# Patient Record
Sex: Male | Born: 1960 | Race: Black or African American | Hispanic: No | Marital: Married | Smoking: Never smoker
Health system: Southern US, Community
[De-identification: ages and names within clinical notes are randomized; demographics above are authoritative.]

## PROBLEM LIST (undated history)

## (undated) DIAGNOSIS — M1990 Unspecified osteoarthritis, unspecified site: Secondary | ICD-10-CM

## (undated) HISTORY — PX: MENISCUS REPAIR: SHX5179

---

## 2015-02-20 ENCOUNTER — Encounter (HOSPITAL_BASED_OUTPATIENT_CLINIC_OR_DEPARTMENT_OTHER): Payer: Self-pay

## 2015-02-20 ENCOUNTER — Emergency Department (HOSPITAL_BASED_OUTPATIENT_CLINIC_OR_DEPARTMENT_OTHER)
Admission: EM | Admit: 2015-02-20 | Discharge: 2015-02-20 | Disposition: A | Discharge status: Home / Self Care | Payer: Worker's Compensation | Attending: Emergency Medicine | Admitting: Emergency Medicine

## 2015-02-20 ENCOUNTER — Emergency Department (HOSPITAL_BASED_OUTPATIENT_CLINIC_OR_DEPARTMENT_OTHER): Payer: Worker's Compensation

## 2015-02-20 DIAGNOSIS — W1839XA Other fall on same level, initial encounter: Secondary | ICD-10-CM | POA: Insufficient documentation

## 2015-02-20 DIAGNOSIS — Y9389 Activity, other specified: Secondary | ICD-10-CM | POA: Insufficient documentation

## 2015-02-20 DIAGNOSIS — Y990 Civilian activity done for income or pay: Secondary | ICD-10-CM | POA: Insufficient documentation

## 2015-02-20 DIAGNOSIS — Z791 Long term (current) use of non-steroidal anti-inflammatories (NSAID): Secondary | ICD-10-CM | POA: Insufficient documentation

## 2015-02-20 DIAGNOSIS — S4991XA Unspecified injury of right shoulder and upper arm, initial encounter: Secondary | ICD-10-CM | POA: Diagnosis present

## 2015-02-20 DIAGNOSIS — M1990 Unspecified osteoarthritis, unspecified site: Secondary | ICD-10-CM | POA: Insufficient documentation

## 2015-02-20 DIAGNOSIS — Y9289 Other specified places as the place of occurrence of the external cause: Secondary | ICD-10-CM | POA: Diagnosis not present

## 2015-02-20 DIAGNOSIS — M79601 Pain in right arm: Secondary | ICD-10-CM

## 2015-02-20 HISTORY — DX: Unspecified osteoarthritis, unspecified site: M19.90

## 2015-02-20 MED ORDER — NAPROXEN 500 MG PO TABS: 500.0000 mg | ORAL_TABLET | Freq: Two times a day (BID) | ORAL | Status: DC

## 2015-02-20 NOTE — ED Notes (Signed)
Fell at work, mat slipped out from under him, fell on used RUE to prevent fall.  Had no c/o dizziness, SOB. Continued to work, next evening Pain in RUE increased , he was unable to lift Right Arm.  Pain is below the shoulder.

## 2015-02-20 NOTE — ED Notes (Signed)
Patient transported to x-ray. ?

## 2015-02-20 NOTE — ED Notes (Signed)
Pt ambulated to room, describes no pain at rest, however can not lift RUE without pain. Pain is an 8 with movement.  He had no episode of dizziness, SOB.  RUE is warm, + strong radial pulse, no c/o numbness, tingling.

## 2015-02-20 NOTE — ED Provider Notes (Signed)
CSN: [id number]     Arrival date & time 02/20/15  4098 History  This chart was scribed for Michael Octave, MD by Leone Payor, ED Scribe. This patient was seen in room MH05/MH05 and the patient's care was started 7:58 AM.     Chief Complaint  Patient presents with  . Arm Injury    R UE  r/t  fall at work   The history is provided by the patient. No language interpreter was used.     HPI Comments: Michael Clements is a 54 y.o. male who presents to the Emergency Department complaining of gradual onset, gradually worsened, constant RUE pain that began after a fall 2 days ago. Patient states a mat slipped out from underneath him causing him to fall on an outstretched right hand. He denies head injury or LOC. He reports having minimal pain at the time of the fall but states it worsened over the following day. He has applied icy-hot ointment without significant relief. He states the pain is aggravated by lifting or sleeping on the affected arm. He denies weakness, numbness, paresthesias, any other pain or injuries.   Past Medical History  Diagnosis Date  . Arthritis    No past surgical history on file. No family history on file. History  Substance Use Topics  . Smoking status: Not on file  . Smokeless tobacco: Not on file  . Alcohol Use: Not on file    Review of Systems  A complete 10 system review of systems was obtained and all systems are negative except as noted in the HPI and PMH.    Allergies  Review of patient's allergies indicates no known allergies.  Home Medications   Prior to Admission medications   Medication Sig Start Date End Date Taking? Authorizing Provider  naproxen (NAPROSYN) 500 MG tablet Take 1 tablet (500 mg total) by mouth 2 (two) times daily. 02/20/15   Michael Octave, MD  naproxen sodium (ANAPROX) 220 MG tablet Take 220 mg by mouth 2 (two) times daily with a meal.   Yes Historical Provider, MD   BP 130/92 mmHg  Pulse 64  Temp(Src) 97.8 F (36.6 C) (Oral)   Resp 16  Ht  (1.702 m)  Wt 204 lb (92.534 kg)  BMI 31.94 kg/m2  SpO2 100% Physical Exam  Constitutional: He is oriented to person, place, and time. He appears well-developed and well-nourished. No distress.  HENT:  Head: Normocephalic and atraumatic.  Mouth/Throat: Oropharynx is clear and moist. No oropharyngeal exudate.  Eyes: Conjunctivae and EOM are normal. Pupils are equal, round, and reactive to light.  Neck: Normal range of motion. Neck supple.  No meningismus.  Cardiovascular: Normal rate, regular rhythm, normal heart sounds and intact distal pulses.   No murmur heard. Pulmonary/Chest: Effort normal and breath sounds normal. No respiratory distress.  Abdominal: Soft. There is no tenderness. There is no rebound and no guarding.  Musculoskeletal: Normal range of motion. He exhibits tenderness. He exhibits no edema.  RUE: Pain to deltoid with abduction. No deformity, no tenderness to shoulder joint. Full ROM of elbow and wrist. Intact radial pulse.  No pain to R wrist. No snuffbox pain.  Neurological: He is alert and oriented to person, place, and time. No cranial nerve deficit. He exhibits normal muscle tone. Coordination normal.  No ataxia on finger to nose bilaterally. No pronator drift. 5/5 strength throughout. CN 2-12 intact. Negative Romberg. Equal grip strength. Sensation intact. Gait is normal.   Skin: Skin is warm.  Psychiatric:  He has a normal mood and affect. His behavior is normal.  Nursing note and vitals reviewed.   ED Course  Procedures (including critical care time)  DIAGNOSTIC STUDIES: Oxygen Saturation is 100% on RA, normal by my interpretation.    COORDINATION OF CARE: 8:04 AM Will order XRAY of right arm. Discussed treatment plan with pt at bedside and pt agreed to plan.   Labs Review Labs Reviewed - No data to display  Imaging Review Dg Shoulder Right  02/20/2015   CLINICAL DATA:  54 year old male who fell at work 2 days ago, slip and fall.  Increasing right mid humerus pain Initial encounter.  EXAM: RIGHT SHOULDER - 2+ VIEW  COMPARISON:  None.  FINDINGS: No glenohumeral joint dislocation. Bone mineralization is within normal limits for age. Proximal right humerus intact. Right clavicle and scapula appear intact. Visible right ribs and lung parenchyma within normal limits.  IMPRESSION: No acute fracture or dislocation identified about the right shoulder.   Electronically Signed   By: Odessa FlemingH  Hall M.D.   On: 02/20/2015 08:43   Dg Humerus Right  02/20/2015   CLINICAL DATA:  54 year old male who fell at work 2 days ago, slip and fall. Increasing right mid humerus pain Initial encounter.  EXAM: RIGHT HUMERUS - 2+ VIEW  COMPARISON:  Right shoulder series from today reported separately.  FINDINGS: Bone mineralization is within normal limits. The right humerus appears intact. Grossly normal alignment at the right elbow. No fracture or dislocation identified.  IMPRESSION: No acute fracture or dislocation identified about the right humerus.   Electronically Signed   By: Odessa FlemingH  Hall M.D.   On: 02/20/2015 08:43     EKG Interpretation None      MDM   Final diagnoses:  Pain of right upper extremity   Slip and fall 2 nights ago landing on outstretched R arm.  Did not hit head or LOC. Pain to lateral humerus, worse with shoulder abduction.  NVI.  No hand or wrist pain.  Xrays negative.  Suspect musculoskeletal pain.  Recommend RICE, NSAIDs, follow up with sports med or occupational health.  I personally performed the services described in this documentation, which was scribed in my presence. The recorded information has been reviewed and is accurate.   Michael OctaveStephen Cahlil Sattar, MD 02/20/15 0900

## 2015-02-20 NOTE — Discharge Instructions (Signed)
Shoulder Sprain °A shoulder sprain is the result of damage to the tough, fiber-like tissues (ligaments) that help hold your shoulder in place. The ligaments may be stretched or torn. Besides the main shoulder joint (the ball and socket), there are several smaller joints that connect the bones in this area. A sprain usually involves one of those joints. Most often it is the acromioclavicular (or AC) joint. That is the joint that connects the collarbone (clavicle) and the shoulder blade (scapula) at the top point of the shoulder blade (acromion). °A shoulder sprain is a mild form of what is called a shoulder separation. Recovering from a shoulder sprain may take some time. For some, pain lingers for several months. Most people recover without long term problems. °CAUSES  °· A shoulder sprain is usually caused by some kind of trauma. This might be: °¨ Falling on an outstretched arm. °¨ Being hit hard on the shoulder. °¨ Twisting the arm. °· Shoulder sprains are more likely to occur in people who: °¨ Play sports. °¨ Have balance or coordination problems. °SYMPTOMS  °· Pain when you move your shoulder. °· Limited ability to move the shoulder. °· Swelling and tenderness on top of the shoulder. °· Redness or warmth in the shoulder. °· Bruising. °· A change in the shape of the shoulder. °DIAGNOSIS  °Your healthcare provider may: °· Ask about your symptoms. °· Ask about recent activity that might have caused those symptoms. °· Examine your shoulder. You may be asked to do simple exercises to test movement. The other shoulder will be examined for comparison. °· Order some tests that provide a look inside the body. They can show the extent of the injury. The tests could include: °¨ X-rays. °¨ CT (computed tomography) scan. °¨ MRI (magnetic resonance imaging) scan. °RISKS AND COMPLICATIONS °· Loss of full shoulder motion. °· Ongoing shoulder pain. °TREATMENT  °How long it takes to recover from a shoulder sprain depends on how  severe it was. Treatment options may include: °· Rest. You should not use the arm or shoulder until it heals. °· Ice. For 2 or 3 days after the injury, put an ice pack on the shoulder up to 4 times a day. It should stay on for 15 to 20 minutes each time. Wrap the ice in a towel so it does not touch your skin. °· Over-the-counter medicine to relieve pain. °· A sling or brace. This will keep the arm still while the shoulder is healing. °· Physical therapy or rehabilitation exercises. These will help you regain strength and motion. Ask your healthcare provider when it is OK to begin these exercises. °· Surgery. The need for surgery is rare with a sprained shoulder, but some people may need surgery to keep the joint in place and reduce pain. °HOME CARE INSTRUCTIONS  °· Ask your healthcare provider about what you should and should not do while your shoulder heals. °· Make sure you know how to apply ice to the correct area of your shoulder. °· Talk with your healthcare provider about which medications should be used for pain and swelling. °· If rehabilitation therapy will be needed, ask your healthcare provider to refer you to a therapist. If it is not recommended, then ask about at-home exercises. Find out when exercise should begin. °SEEK MEDICAL CARE IF:  °Your pain, swelling, or redness at the joint increases. °SEEK IMMEDIATE MEDICAL CARE IF:  °· You have a fever. °· You cannot move your arm or shoulder. °Document Released: 03/14/2009 Document   Revised: 01/18/2012 Document Reviewed: 03/14/2009 °[employer]® Patient Information ©2015 [employer]. This information is not intended to replace advice given to you by your health care provider. Make sure you discuss any questions you have with your health care provider. ° °

## 2015-02-20 NOTE — ED Notes (Signed)
Pt verbalizes understanding of D/C instructions.

## 2015-07-24 ENCOUNTER — Emergency Department (HOSPITAL_BASED_OUTPATIENT_CLINIC_OR_DEPARTMENT_OTHER)
Admission: EM | Admit: 2015-07-24 | Discharge: 2015-07-24 | Disposition: A | Discharge status: Home / Self Care | Payer: BLUE CROSS/BLUE SHIELD | Attending: Emergency Medicine | Admitting: Emergency Medicine

## 2015-07-24 ENCOUNTER — Emergency Department (HOSPITAL_BASED_OUTPATIENT_CLINIC_OR_DEPARTMENT_OTHER): Payer: BLUE CROSS/BLUE SHIELD

## 2015-07-24 ENCOUNTER — Encounter (HOSPITAL_BASED_OUTPATIENT_CLINIC_OR_DEPARTMENT_OTHER): Payer: Self-pay

## 2015-07-24 DIAGNOSIS — R609 Edema, unspecified: Secondary | ICD-10-CM | POA: Diagnosis not present

## 2015-07-24 DIAGNOSIS — M7989 Other specified soft tissue disorders: Secondary | ICD-10-CM

## 2015-07-24 DIAGNOSIS — R600 Localized edema: Secondary | ICD-10-CM

## 2015-07-24 NOTE — ED Notes (Signed)
Right foot swelling x today-denies injury-right knee surgery in July

## 2015-07-24 NOTE — ED Provider Notes (Addendum)
CSN: [id number]     Arrival date & time 07/24/15  2125 History   This chart was scribed for Arby Barrette, MD by Arlan Organ, ED Scribe. This patient was seen in room MH01/MH01 and the patient's care was started 9:35 PM.   Chief Complaint  Patient presents with  . Foot Swelling   HPI  HPI Comments: Michael Clements is a 54 y.o. male with a PMHx of arthritis who presents to the Emergency Department complaining of constant, ongoing swelling to the R foot x 1 day. Denies any recent injury or trauma. No aggravating or alleviating factors at this time. No home remedies attempted prior to arrival. Denies any recent fever, chills, nausea, vomiting, shortness of breath, or chest pain. No weakness, loss of sensation, or numbness. PSHx includes R meniscus tear repair-July 2016. Patient just went back to work last week. He also has started continuously wearing a knee sleeve. He had no pain associated with this and only noted the swelling by visual inspection after he got out of the shower tonight. No known allergies to medications.  Past Medical History  Diagnosis Date  . Arthritis    Past Surgical History  Procedure Laterality Date  . Meniscus repair     No family history on file. Social History  Substance Use Topics  . Smoking status: Never Smoker   . Smokeless tobacco: None  . Alcohol Use: No    Review of Systems  A complete 10 system review of systems was obtained and all systems are negative except as noted in the HPI and PMH.    Allergies  Review of patient's allergies indicates no known allergies.  Home Medications   Prior to Admission medications   Medication Sig Start Date End Date Taking? Authorizing Provider  UNKNOWN TO PATIENT RX pain med for knee   Yes Historical Provider, MD   Triage Vitals: BP 144/87 mmHg  Pulse 87  Temp(Src) 98.5 F (36.9 C) (Oral)  Resp 18  Ht  (1.702 m)  Wt 215 lb (97.523 kg)  BMI 33.67 kg/m2  SpO2 98%   Physical Exam  Constitutional: He  is oriented to person, place, and time. He appears well-developed and well-nourished.  Pulmonary/Chest: Effort normal.  Musculoskeletal:  Moderate swelling of right foot. No erythema or signs of secondary infection.  Neurological: He is alert and oriented to person, place, and time. Coordination normal.    ED Course  Procedures (including critical care time)  DIAGNOSTIC STUDIES: Oxygen Saturation is 98% on RA, Normal by my interpretation.    COORDINATION OF CARE: 9:39 PM-Discussed treatment plan with pt at bedside and pt agreed to plan.     Labs Review Labs Reviewed - No data to display  Imaging Review US Venous Img Lower Unilateral Right  07/24/2015   CLINICAL DATA:  54 year old male with right foot swelling  EXAM: Right LOWER EXTREMITY VENOUS DOPPLER ULTRASOUND  TECHNIQUE: Gray-scale sonography with graded compression, as well as color Doppler and duplex ultrasound were performed to evaluate the lower extremity deep venous systems from the level of the common femoral vein and including the common femoral, femoral, profunda femoral, popliteal and calf veins including the posterior tibial, peroneal and gastrocnemius veins when visible. The superficial great saphenous vein was also interrogated. Spectral Doppler was utilized to evaluate flow at rest and with distal augmentation maneuvers in the common femoral, femoral and popliteal veins.  COMPARISON:  None.  FINDINGS: Contralateral Common Femoral Vein: Respiratory phasicity is normal and symmetric with the symptomatic  side. No evidence of thrombus. Normal compressibility.  Common Femoral Vein: No evidence of thrombus. Normal compressibility, respiratory phasicity and response to augmentation.  Saphenofemoral Junction: No evidence of thrombus. Normal compressibility and flow on color Doppler imaging.  Profunda Femoral Vein: No evidence of thrombus. Normal compressibility and flow on color Doppler imaging.  Femoral Vein: No evidence of thrombus.  Normal compressibility, respiratory phasicity and response to augmentation.  Popliteal Vein: No evidence of thrombus. Normal compressibility, respiratory phasicity and response to augmentation.  Calf Veins: No evidence of thrombus. Normal compressibility and flow on color Doppler imaging.  Other Findings:  None.  IMPRESSION: No evidence of deep venous thrombosis.   Electronically Signed   By: Elgie Collard M.D.   On: 07/24/2015 22:47   I have personally reviewed and evaluated these images and lab results as part of my medical decision-making.   EKG Interpretation None      MDM   Final diagnoses:  Edema of right lower extremity   DVT has been ruled out. Findings are consistent with venous stasis with compression at the knee and prolonged standing. Patient has been instructed on management. He is instructed to follow-up with orthopedics for ongoing management of his meniscal repair. No evidence of any acute complications relative to the knee surgery.   Arby Barrette, MD 07/24/15 [id number]  Arby Barrette, MD 09/19/15 8119  Arby Barrette, MD 09/19/15 [phone]

## 2015-07-24 NOTE — ED Notes (Signed)
Patient transported to Ultrasound 

## 2015-07-24 NOTE — Discharge Instructions (Signed)

## 2017-05-24 IMAGING — US US EXTREM LOW VENOUS*R*
1 series · 13 of 24 positions shown · non-contrast
Comparison: None.

CLINICAL DATA: 54-year-old male with right foot swelling



[Series 1: us extrem low venous*right* · 0.11mm/px · 27 acquisitions, 13 frames shown]
[im 1/27]
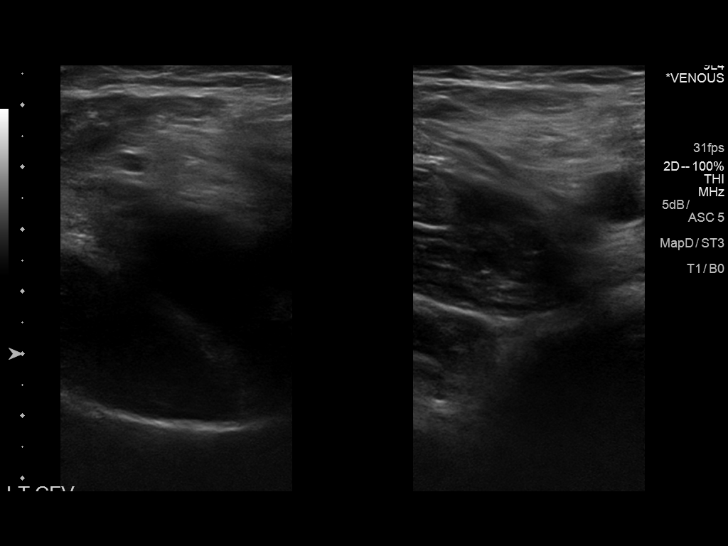
[im 3/27]
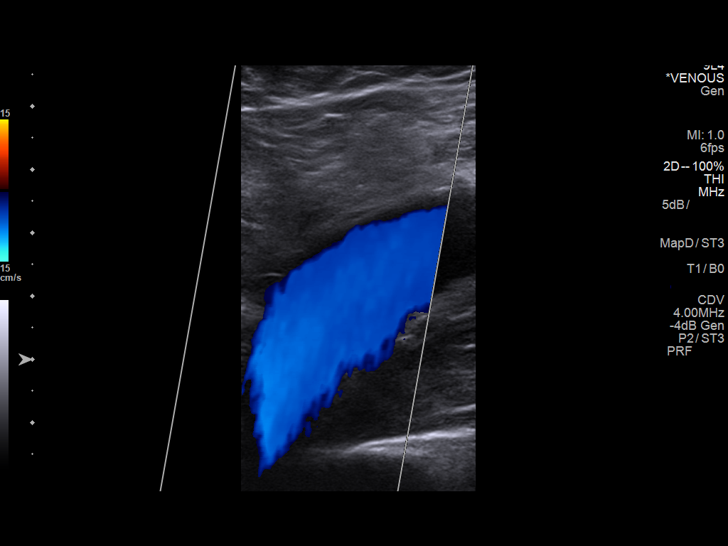
[im 5/27]
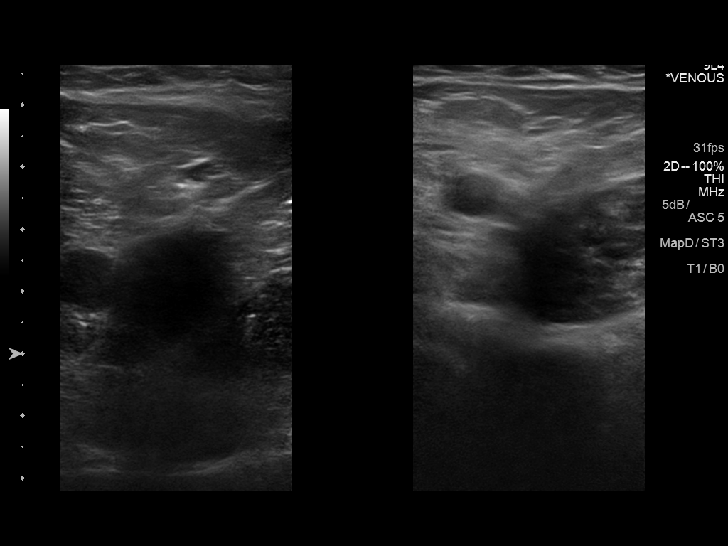
[im 7/27]
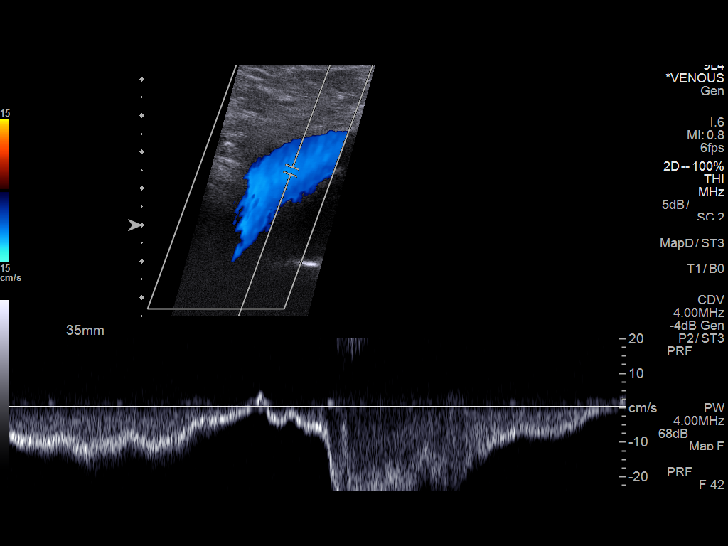
[im 10/27]
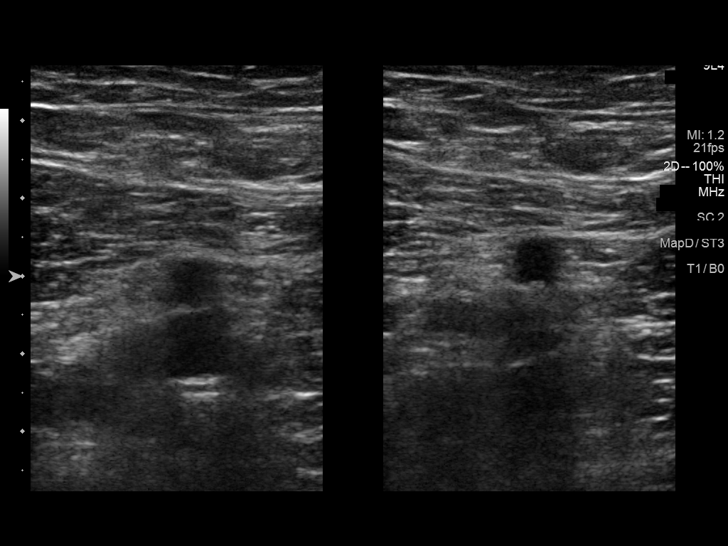
[im 12/27]
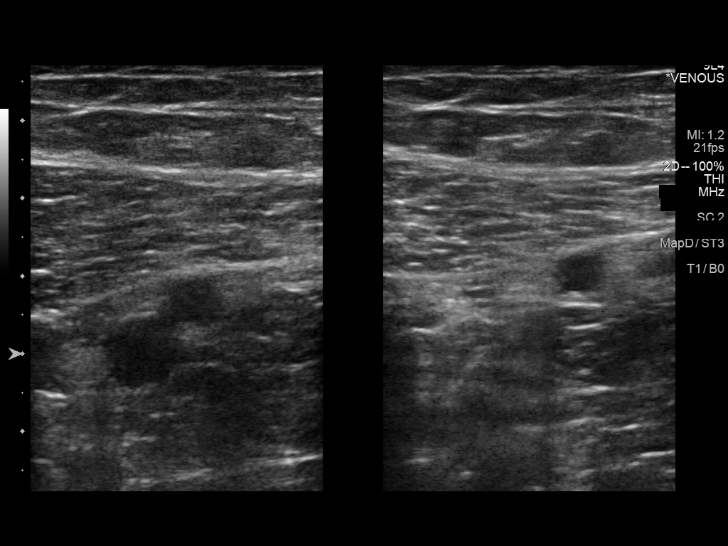
[im 15/27]
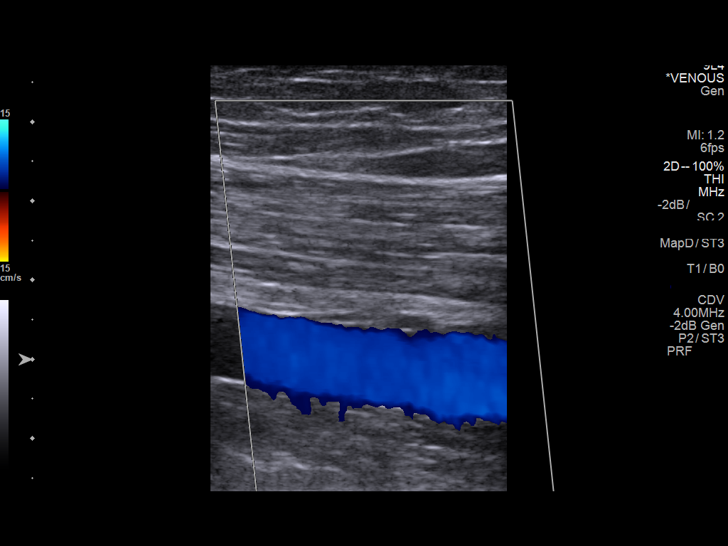
[im 16/27]
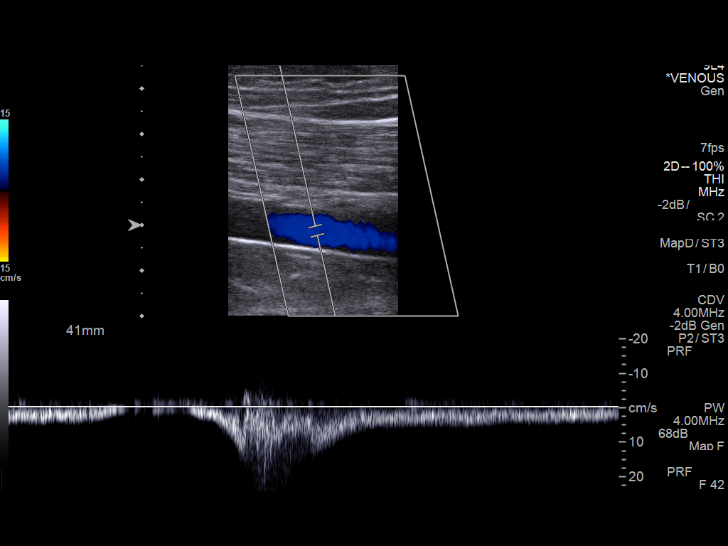
[im 19/27]
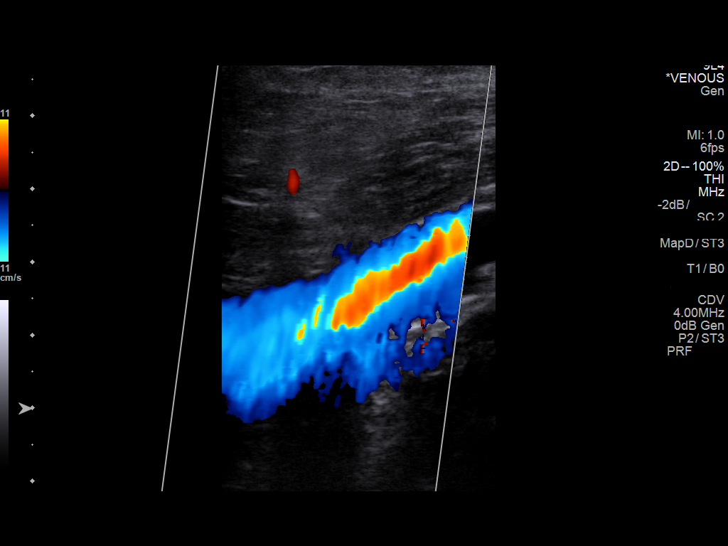
[im 21/27]
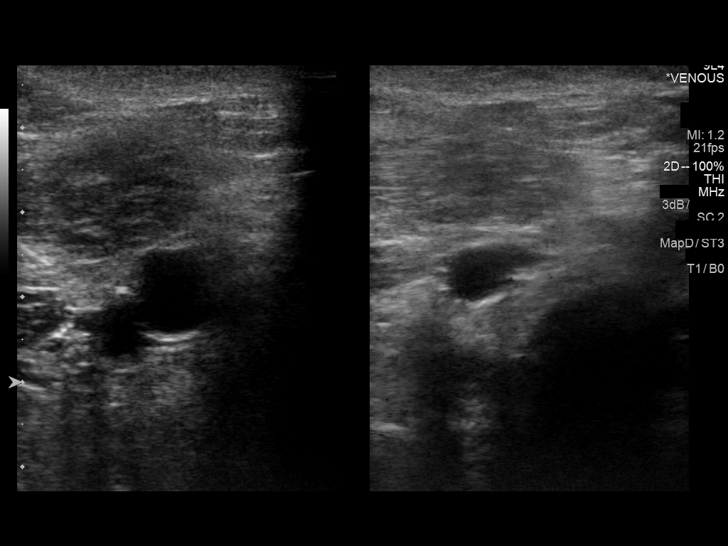
[im 23/27]
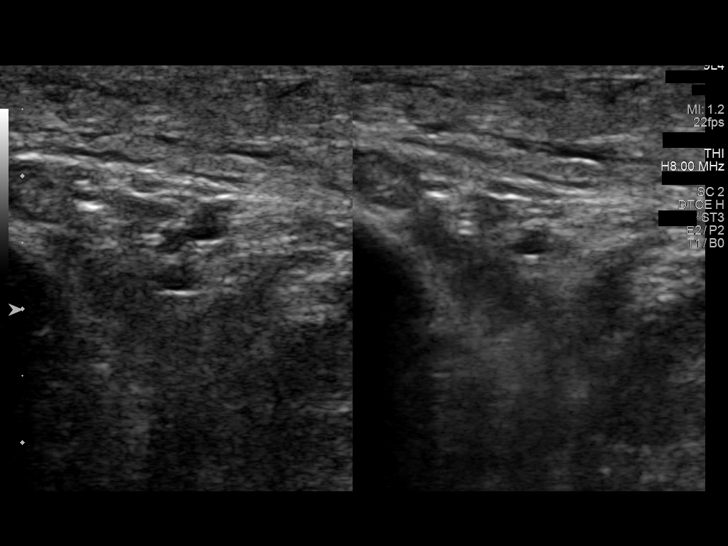
[im 24/27]
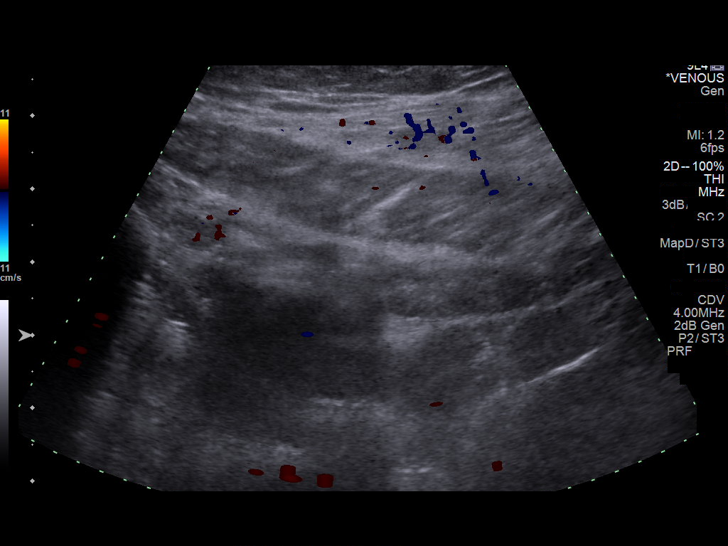
[im 27/27]
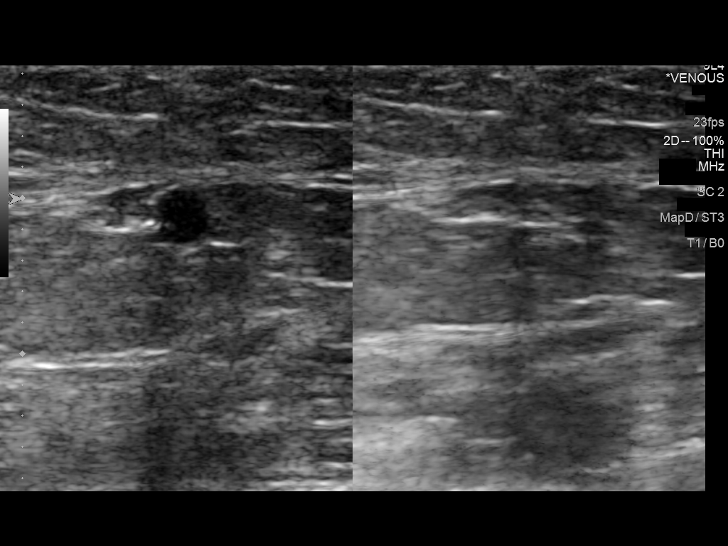

[13 of 24 positions shown; findings below may reference images not displayed]

FINDINGS: Contralateral Common Femoral Vein: Respiratory phasicity is normal
and symmetric with the symptomatic side. No evidence of thrombus.
Normal compressibility.

Common Femoral Vein: No evidence of thrombus. Normal
compressibility, respiratory phasicity and response to augmentation.

Saphenofemoral Junction: No evidence of thrombus. Normal
compressibility and flow on color Doppler imaging.

Profunda Femoral Vein: No evidence of thrombus. Normal
compressibility and flow on color Doppler imaging.

Femoral Vein: No evidence of thrombus. Normal compressibility,
respiratory phasicity and response to augmentation.

Popliteal Vein: No evidence of thrombus. Normal compressibility,
respiratory phasicity and response to augmentation.

Calf Veins: No evidence of thrombus. Normal compressibility and flow
on color Doppler imaging.

Other Findings:  None.
IMPRESSION: No evidence of deep venous thrombosis.
